# Patient Record
Sex: Male | Born: 1970 | Race: White | Hispanic: No | Marital: Married | Smoking: Current every day smoker
Health system: Southern US, Community
[De-identification: ages and names within clinical notes are randomized; demographics above are authoritative.]

---

## 2014-10-10 ENCOUNTER — Emergency Department (HOSPITAL_COMMUNITY): Payer: Self-pay

## 2014-10-10 ENCOUNTER — Encounter (HOSPITAL_COMMUNITY): Payer: Self-pay | Admitting: Emergency Medicine

## 2014-10-10 ENCOUNTER — Emergency Department (HOSPITAL_COMMUNITY)
Admission: EM | Admit: 2014-10-10 | Discharge: 2014-10-10 | Disposition: A | Discharge status: Home / Self Care | Payer: Self-pay | Attending: Emergency Medicine | Admitting: Emergency Medicine

## 2014-10-10 DIAGNOSIS — M25461 Effusion, right knee: Secondary | ICD-10-CM | POA: Insufficient documentation

## 2014-10-10 DIAGNOSIS — Z720 Tobacco use: Secondary | ICD-10-CM | POA: Insufficient documentation

## 2014-10-10 DIAGNOSIS — M1711 Unilateral primary osteoarthritis, right knee: Secondary | ICD-10-CM | POA: Insufficient documentation

## 2014-10-10 MED ORDER — DEXAMETHASONE 4 MG PO TABS: 4.0000 mg | ORAL_TABLET | Freq: Two times a day (BID) | ORAL | Status: AC

## 2014-10-10 MED ORDER — KETOROLAC TROMETHAMINE 10 MG PO TABS
10.0000 mg | ORAL_TABLET | Freq: Once | ORAL | Status: AC
  Administered 2014-10-10: 10 mg via ORAL
  Filled 2014-10-10: qty 1

## 2014-10-10 MED ORDER — DICLOFENAC SODIUM 75 MG PO TBEC: 75.0000 mg | DELAYED_RELEASE_TABLET | Freq: Two times a day (BID) | ORAL | Status: DC

## 2014-10-10 MED ORDER — PREDNISONE 50 MG PO TABS
60.0000 mg | ORAL_TABLET | Freq: Once | ORAL | Status: AC
  Administered 2014-10-10: 60 mg via ORAL
  Filled 2014-10-10 (×2): qty 1

## 2014-10-10 MED ORDER — ONDANSETRON HCL 4 MG PO TABS
4.0000 mg | ORAL_TABLET | Freq: Once | ORAL | Status: AC
  Administered 2014-10-10: 4 mg via ORAL
  Filled 2014-10-10: qty 1

## 2014-10-10 NOTE — Discharge Instructions (Signed)
Knee Effusion  Knee effusion means you have fluid in your knee. The knee may be more difficult to bend and move. HOME CARE  Use crutches or a brace as told by your doctor.  Put ice on the injured area.  Put ice in a plastic bag.  Place a towel between your skin and the bag.  Leave the ice on for 15-20 minutes, 03-04 times a day.  Raise (elevate) your knee as much as possible.  Only take medicine as told by your doctor.  You may need to do strengthening exercises. Ask your doctor.  Continue with your normal diet and activities as told by your doctor. GET HELP RIGHT AWAY IF:  You have more puffiness (swelling) in your knee.  You see redness, puffiness, or have more pain in your knee.  You have a temperature by mouth above 102 F (38.9 C).  You get a rash.  You have trouble breathing.  You have a reaction to any medicine you are taking.  You have a lot of pain when you move your knee. MAKE SURE YOU:  Understand these instructions.  Will watch your condition.  Will get help right away if you are not doing well or get worse. Document Released: 09/21/2010 Document Revised: 11/11/2011 Document Reviewed: 09/21/2010 [facility] Patient Information 2015 [place], [place]. This information is not intended to replace advice given to you by your health care provider. Make sure you discuss any questions you have with your health care provider.  Arthritis, Nonspecific Arthritis is pain, redness, warmth, or puffiness (inflammation) of a joint. The joint may be stiff or hurt when you move it. One or more joints may be affected. There are many types of arthritis. Your doctor may not know what type you have right away. The most common cause of arthritis is wear and tear on the joint (osteoarthritis). HOME CARE   Only take medicine as told by your doctor.  Rest the joint as much as possible.  Raise (elevate) your joint if it is puffy.  Use crutches if the painful joint is in your  leg.  Drink enough fluids to keep your pee (urine) clear or pale yellow.  Follow your doctor's diet instructions.  Use cold packs for very bad joint pain for 10 to 15 minutes every hour. Ask your doctor if it is okay for you to use hot packs.  Exercise as told by your doctor.  Take a warm shower if you have stiffness in the morning.  Move your sore joints throughout the day. GET HELP RIGHT AWAY IF:   You have a fever.  You have very bad joint pain, puffiness, or redness.  You have many joints that are painful and puffy.  You are not getting better with treatment.  You have very bad back pain or leg weakness.  You cannot control when you poop (bowel movement) or pee (urinate).  You do not feel better in 24 hours or are getting worse.  You are having side effects from your medicine. MAKE SURE YOU:   Understand these instructions.  Will watch your condition.  Will get help right away if you are not doing well or get worse. Document Released: 11/13/2009 Document Revised: 02/18/2012 Document Reviewed: 11/13/2009 [facility] Patient Information 2015 [place], [place]. This information is not intended to replace advice given to you by your health care provider. Make sure you discuss any questions you have with your health care provider.

## 2014-10-10 NOTE — ED Provider Notes (Signed)
CSN: [id number]     Arrival date & time 10/10/14  1007 History  This chart was scribed for Ivery Quale, PA-C with Vanetta Mulders, MD by Tonye Royalty, ED Scribe. This patient was seen in room APFT21/APFT21 and the patient's care was started at 12:16 PM.    Chief Complaint  Patient presents with  . Knee Pain   Patient is a 44 y.o. male presenting with knee pain. The history is provided by the patient. No language interpreter was used.  Knee Pain Location:  Knee Time since incident:  1 week Injury: no   Knee location:  L knee Pain details:    Quality:  Unable to specify   Radiates to:  Does not radiate   Severity:  Moderate   Onset quality:  Sudden   Duration:  1 week   Timing:  Constant   Progression:  Unchanged Chronicity:  New Dislocation: no   Foreign body present:  No foreign bodies Tetanus status:  Unknown Prior injury to area:  No Relieved by:  None tried Worsened by:  Nothing tried Ineffective treatments:  None tried Associated symptoms: swelling   Associated symptoms: no back pain   Risk factors: no frequent fractures and no obesity     HPI Comments: Patrick Atkinson is a 44 y.o. male who presents to the Emergency Department complaining of left knee pain and swelling with onset 1 week ago after lifting his wife. He states he does a lot of lifting and bending with the knee; he notes his wife is a paraplegic. He denies playing any sports but states he worked out a lot. He denies prior operations on his knee.   History reviewed. No pertinent past medical history. History reviewed. No pertinent past surgical history. No family history on file. History  Substance Use Topics  . Smoking status: Current Every Day Smoker -- 1.00 packs/day    Types: Cigarettes  . Smokeless tobacco: Not on file  . Alcohol Use: No    Review of Systems  Musculoskeletal: Negative for back pain.       Left knee pain and swelling  Neurological: Negative for numbness.  All other systems reviewed  and are negative.     Allergies  Review of patient's allergies indicates no known allergies.  Home Medications   Prior to Admission medications   Medication Sig Start Date End Date Taking? Authorizing Provider  acetaminophen (TYLENOL) 500 MG tablet Take 1,000 mg by mouth every 6 (six) hours as needed for moderate pain.   Yes Historical Provider, MD  ibuprofen (ADVIL,MOTRIN) 200 MG tablet Take 400 mg by mouth every 6 (six) hours as needed for moderate pain.   Yes Historical Provider, MD   BP 112/86 mmHg  Pulse 69  Temp(Src) 98.3 F (36.8 C)  Resp 18  Ht  (1.676 m)  Wt 156 lb (70.761 kg)  BMI 25.19 kg/m2  SpO2 100% Physical Exam  Constitutional: He is oriented to person, place, and time. He appears well-developed and well-nourished.  HENT:  Head: Normocephalic and atraumatic.  Eyes: Conjunctivae are normal.  Neck: Normal range of motion. Neck supple.  Cardiovascular: Normal rate, regular rhythm and normal heart sounds.   No murmur heard. Pulmonary/Chest: Effort normal and breath sounds normal. No respiratory distress. He has no wheezes. He has no rales.  Musculoskeletal: Normal range of motion.  The left knee is not hot Moderate effusion of the joint No deformity of the anterior tibial tuberosity  No deformity of the quadricep Patella in  the midline with decreased mobility No edema of the lower extremity  Neurological: He is alert and oriented to person, place, and time.  No motor deficit appreciated  Skin: Skin is warm and dry.  Psychiatric: He has a normal mood and affect.  Nursing note and vitals reviewed.   ED Course  Procedures (including critical care time)  DIAGNOSTIC STUDIES: Oxygen Saturation is 100% on room air, normal by my interpretation.    COORDINATION OF CARE: 12:21 PM Discussed with patient that his x-ray reveals of evidence of significant arthritis with bone spur and chondrocalcinosis. Discussed treatment plan with patient at beside, including  follow up with orthopedist. The patient agrees with the plan and has no further questions at this time.   Labs Review Labs Reviewed - No data to display  Imaging Review Dg Knee Complete 4 Views Left  10/10/2014   CLINICAL DATA:  Twisting injury left knee 10/03/2014. Continued pain. Initial encounter.  EXAM: LEFT KNEE - COMPLETE 4+ VIEW  COMPARISON:  None.  FINDINGS: Moderate to moderately large joint effusion is identified. No acute bony or joint abnormality is seen. Mild chondrocalcinosis is noted. Small spur off the superior pole of the patella is identified.  IMPRESSION: Moderate to moderately large joint effusion.  Negative for fracture.  Mild chondrocalcinosis.   Electronically Signed   By: Drusilla Kannerhomas  Dalessio M.D.   On: 10/10/2014 11:11     EKG Interpretation None      MDM  No hot joints appreciated. No evidence for dislocation or fracture. X-ray of the left knee reveals advanced degenerative changes with small spur noted at the superior pole of the patella, and moderate to moderately large joint effusion. Is also evidence of slow chondrocalcinosis.  The patient will be referred to orthopedics. He'll be treated with Decadron and Celebrex.    Final diagnoses:  None    **I have reviewed nursing notes, vital signs, and all appropriate lab and imaging results for this patient.*  *I personally performed the services described in this documentation, which was scribed in my presence. The recorded information has been reviewed and is accurate.Kathie Dike*  Debie Ashline M Aedon Deason, PA-C 10/10/14 1245  Vanetta MuldersScott Zackowski, MD 10/12/14 [phone]

## 2014-10-10 NOTE — ED Notes (Signed)
Pt c/o left knee pain and swelling x 7 days after lifting his wife.

## 2014-10-10 NOTE — ED Notes (Signed)
Gave patient ice pack to place on knee.

## 2014-11-03 ENCOUNTER — Emergency Department (HOSPITAL_COMMUNITY)
Admission: EM | Admit: 2014-11-03 | Discharge: 2014-11-03 | Disposition: A | Discharge status: Home / Self Care | Payer: Self-pay | Attending: Emergency Medicine | Admitting: Emergency Medicine

## 2014-11-03 ENCOUNTER — Encounter (HOSPITAL_COMMUNITY): Payer: Self-pay | Admitting: *Deleted

## 2014-11-03 DIAGNOSIS — Z720 Tobacco use: Secondary | ICD-10-CM | POA: Insufficient documentation

## 2014-11-03 DIAGNOSIS — Z791 Long term (current) use of non-steroidal anti-inflammatories (NSAID): Secondary | ICD-10-CM | POA: Insufficient documentation

## 2014-11-03 DIAGNOSIS — N200 Calculus of kidney: Secondary | ICD-10-CM | POA: Insufficient documentation

## 2014-11-03 DIAGNOSIS — Z79899 Other long term (current) drug therapy: Secondary | ICD-10-CM | POA: Insufficient documentation

## 2014-11-03 LAB — URINALYSIS, ROUTINE W REFLEX MICROSCOPIC
Bilirubin Urine: NEGATIVE
Glucose, UA: NEGATIVE mg/dL
Ketones, ur: NEGATIVE mg/dL
Leukocytes, UA: NEGATIVE
Nitrite: NEGATIVE
Protein, ur: NEGATIVE mg/dL
Specific Gravity, Urine: 1.01 (ref 1.005–1.030)
Urobilinogen, UA: 0.2 mg/dL (ref 0.0–1.0)
pH: 6 (ref 5.0–8.0)

## 2014-11-03 LAB — URINE MICROSCOPIC-ADD ON

## 2014-11-03 MED ORDER — TAMSULOSIN HCL 0.4 MG PO CAPS: 0.4000 mg | ORAL_CAPSULE | Freq: Every day | ORAL | Status: AC

## 2014-11-03 MED ORDER — KETOROLAC TROMETHAMINE 60 MG/2ML IM SOLN
60.0000 mg | Freq: Once | INTRAMUSCULAR | Status: AC
  Administered 2014-11-03: 60 mg via INTRAMUSCULAR
  Filled 2014-11-03: qty 2

## 2014-11-03 MED ORDER — IBUPROFEN 800 MG PO TABS: 800.0000 mg | ORAL_TABLET | Freq: Three times a day (TID) | ORAL | Status: AC

## 2014-11-03 MED ORDER — ONDANSETRON HCL 4 MG PO TABS: 4.0000 mg | ORAL_TABLET | Freq: Four times a day (QID) | ORAL | Status: AC

## 2014-11-03 MED ORDER — OXYCODONE-ACETAMINOPHEN 5-325 MG PO TABS: 2.0000 | ORAL_TABLET | ORAL | Status: AC | PRN

## 2014-11-03 NOTE — ED Notes (Signed)
Low back pain, nausea, hematuria, dysuria,

## 2014-11-03 NOTE — ED Notes (Signed)
Dr Hyacinth MeekerMiller states we are waiting on patients UA result before he can be discharged.

## 2014-11-03 NOTE — ED Provider Notes (Signed)
44 year old male with no prior history of urinary calculi or urinary infections who presents after having acute onset of right flank pain several days ago. This has been intermittent, he noticed that yesterday he had severe dysuria and red urine, today he does have some residual discomfort but it is not as bad as it was yesterday. This pain is also worse with moving somewhat. He has no lower extremity symptoms, no pain in his bilateral testicles, he does have some residual pain in his penile shaft. On exam the patient has clear urine at the bedside which appears yellow, no significant abdominal discomfort, no lymphadenopathy in the groin, no CVA tenderness. Bedside ultrasound results show no signs of hydronephrosis. Check urinalysis, anticipate discharge with pain medication, possibly antibiotics if infected that this does appear that he has passed a kidney stone.  Emergency Focused Ultrasound Exam Limited retroperitoneal ultrasound of kidneys  Performed and interpreted by Dr. Hyacinth MeekerMiller Indication: flank pain Focused abdominal ultrasound with both kidneys imaged in transverse and longitudinal planes in real-time. Interpretation: No hydronephrosis visualized.   Images archived electronically  Medical screening examination/treatment/procedure(s) were conducted as a shared visit with non-physician practitioner(s) and myself.  I personally evaluated the patient during the encounter.  Clinical Impression:   Final diagnoses:  Nephrolithiasis         Vida RollerBrian D Kymere Fullington, MD 11/03/14 2350

## 2014-11-03 NOTE — Discharge Instructions (Signed)
Cool Mist Vaporizers Vaporizers may help relieve the symptoms of a cough and cold. They add moisture to the air, which helps mucus to become thinner and less sticky. This makes it easier to breathe and cough up secretions. Cool mist vaporizers do not cause serious burns like hot mist vaporizers, which may also be called steamers or humidifiers. Vaporizers have not been proven to help with colds. You should not use a vaporizer if you are allergic to mold. HOME CARE INSTRUCTIONS  Follow the package instructions for the vaporizer.  Do not use anything other than distilled water in the vaporizer.  Do not run the vaporizer all of the time. This can cause mold or bacteria to grow in the vaporizer.  Clean the vaporizer after each time it is used.  Clean and dry the vaporizer well before storing it.  Stop using the vaporizer if worsening respiratory symptoms develop. Document Released: 05/16/2004 Document Revised: 08/24/2013 Document Reviewed: 01/06/2013 [facility]Exit[facility] Patient Information 2015 [place], [place]. This information is not intended to replace advice given to you by your health care provider. Make sure you discuss any questions you have with your health care provider.  Please continue Ibuprofen and tylenol therapy as recommended by pediatrician. If symptoms worsen seek immediate medical care.  Dietary Guidelines to Help Prevent Kidney Stones Your risk of kidney stones can be decreased by adjusting the foods you eat. The most important thing you can do is drink enough fluid. You should drink enough fluid to keep your urine clear or pale yellow. The following guidelines provide specific information for the type of kidney stone you have had. GUIDELINES ACCORDING TO TYPE OF KIDNEY STONE Calcium Oxalate Kidney Stones  Reduce the amount of salt you eat. Foods that have a lot of salt cause your body to release excess calcium into your urine. The excess calcium can combine with a substance called oxalate  to form kidney stones.  Reduce the amount of animal protein you eat if the amount you eat is excessive. Animal protein causes your body to release excess calcium into your urine. Ask your dietitian how much protein from animal sources you should be eating.  Avoid foods that are high in oxalates. If you take vitamins, they should have less than 500 mg of vitamin C. Your body turns vitamin C into oxalates. You do not need to avoid fruits and vegetables high in vitamin C. Calcium Phosphate Kidney Stones  Reduce the amount of salt you eat to help prevent the release of excess calcium into your urine.  Reduce the amount of animal protein you eat if the amount you eat is excessive. Animal protein causes your body to release excess calcium into your urine. Ask your dietitian how much protein from animal sources you should be eating.  Get enough calcium from food or take a calcium supplement (ask your dietitian for recommendations). Food sources of calcium that do not increase your risk of kidney stones include:  Broccoli.  Dairy products, such as cheese and yogurt.  Pudding. Uric Acid Kidney Stones  Do not have more than 6 oz of animal protein per day. FOOD SOURCES Animal Protein Sources  Meat (all types).  Poultry.  Eggs.  Fish, seafood. Foods High in MirantSalt  Salt seasonings.  Soy sauce.  Teriyaki sauce.  Cured and processed meats.  Salted crackers and snack foods.  Fast food.  Canned soups and most canned foods. Foods High in Oxalates  Grains:  Amaranth.  Barley.  Grits.  Wheat germ.  Bran.  Buckwheat flour.  All bran cereals.  Pretzels.  Whole wheat bread.  Vegetables:  Beans (wax).  Beets and beet greens.  Collard greens.  Eggplant.  Escarole.  Leeks.  Okra.  Parsley.  Rutabagas.  Spinach.  Swiss chard.  Tomato paste.  Fried potatoes.  Sweet potatoes.  Fruits:  Red currants.  Figs.  Kiwi.  Rhubarb.  Meat and Other  Protein Sources:  Beans (dried).  Soy burgers and other soybean products.  Miso.  Nuts (peanuts, almonds, pecans, cashews, hazelnuts).  Nut butters.  Sesame seeds and tahini (paste made of sesame seeds).  Poppy seeds.  Beverages:  Chocolate drink mixes.  Soy milk.  Instant iced tea.  Juices made from high-oxalate fruits or vegetables.  Other:  Carob.  Chocolate.  Fruitcake.  Marmalades. Document Released: 12/14/2010 Document Revised: 08/24/2013 Document Reviewed: 07/16/2013 [facility] Patient Information 2015 [place], [place]. This information is not intended to replace advice given to you by your health care provider. Make sure you discuss any questions you have with your health care provider.  Kidney Stones Kidney stones (urolithiasis) are solid masses that form inside your kidneys. The intense pain is caused by the stone moving through the kidney, ureter, bladder, and urethra (urinary tract). When the stone moves, the ureter starts to spasm around the stone. The stone is usually passed in your pee (urine).  HOME CARE  Drink enough fluids to keep your pee clear or pale yellow. This helps to get the stone out.  Strain all pee through the provided strainer. Do not pee without peeing through the strainer, not even once. If you pee the stone out, catch it in the strainer. The stone may be as small as a grain of salt. Take this to your doctor. This will help your doctor figure out what you can do to try to prevent more kidney stones.  Only take medicine as told by your doctor.  Follow up with your doctor as told.  Get follow-up X-rays as told by your doctor. GET HELP IF: You have pain that gets worse even if you have been taking pain medicine. GET HELP RIGHT AWAY IF:   Your pain does not get better with medicine.  You have a fever or shaking chills.  Your pain increases and gets worse over 18 hours.  You have new belly (abdominal) pain.  You feel faint or pass  out.  You are unable to pee. MAKE SURE YOU:   Understand these instructions.  Will watch your condition.  Will get help right away if you are not doing well or get worse. Document Released: 02/05/2008 Document Revised: 04/21/2013 Document Reviewed: 01/20/2013 [facility] Patient Information 2015 [place], [place]. This information is not intended to replace advice given to you by your health care provider. Make sure you discuss any questions you have with your health care provider.  Please follow-up with PCP for further management of recurrent kidney stones. If symptoms worsen please return to Ed for further evaluation.

## 2014-11-03 NOTE — ED Provider Notes (Signed)
CSN: [id number]     Arrival date & time 11/03/14  1449 History   First MD Initiated Contact with Patient 11/03/14 1526     Chief Complaint  Patient presents with  . Hematuria   HPI   [age] YOM presents after 3 days of right flank pain, painful urination, and hematuria. He states he first noticed the back pain follwed by localized flank pain. He reports a decrease in urine output followed by an acute episode of painful burning urination with gross hematuria. He notes after that episode urination were normal with no pain. He continues to endorse right flank pain but not to the extent he had before. Also notes N/V. Pt some mild abdominal discomfort that is described as pressure radiating to the flank. Denies inguinal or testicular pain, no bulge noted in groin/strotum. Reports sweats associated with pain, denies fever, SOB, or chest pain.   Denies history of the same, reports his only medical history is an arthritic that was treated with a course of NSAIDS.   History reviewed. No pertinent past medical history. History reviewed. No pertinent past surgical history. History reviewed. No pertinent family history. History  Substance Use Topics  . Smoking status: Current Every Day Smoker -- 1.00 packs/day    Types: Cigarettes  . Smokeless tobacco: Not on file  . Alcohol Use: No    Review of Systems  All other systems reviewed and are negative.   Allergies  Review of patient's allergies indicates no known allergies.  Home Medications   Prior to Admission medications   Medication Sig Start Date End Date Taking? Authorizing Provider  acetaminophen (TYLENOL) 500 MG tablet Take 1,000 mg by mouth every 6 (six) hours as needed for moderate pain.    Historical Provider, MD  dexamethasone (DECADRON) 4 MG tablet Take 1 tablet (4 mg total) by mouth 2 (two) times daily with a meal. 10/10/14   Kathie DikeHobson M Bryant, PA-C  diclofenac (VOLTAREN) 75 MG EC tablet Take 1 tablet (75 mg total) by mouth 2 (two) times  daily. 10/10/14   Kathie DikeHobson M Bryant, PA-C  ibuprofen (ADVIL,MOTRIN) 200 MG tablet Take 400 mg by mouth every 6 (six) hours as needed for moderate pain.    Historical Provider, MD   BP 149/87 mmHg  Pulse 109  Temp(Src) 98.2 F (36.8 C) (Oral)  Resp 20  Ht 5\' 6"  (1.676 m)  Wt 165 lb (74.844 kg)  BMI 26.64 kg/m2  SpO2 98% Physical Exam  Constitutional: He is oriented to person, place, and time. He appears well-developed and well-nourished.  HENT:  Head: Normocephalic and atraumatic.  Eyes: Pupils are equal, round, and reactive to light.  Neck: Normal range of motion. Neck supple. No JVD present. No tracheal deviation present. No thyromegaly present.  Cardiovascular: Normal rate, regular rhythm, normal heart sounds and intact distal pulses.  Exam reveals no gallop and no friction rub.   No murmur heard. Pulmonary/Chest: Effort normal and breath sounds normal. No stridor. No respiratory distress. He has no wheezes. He has no rales. He exhibits no tenderness.  Abdominal: Soft. Bowel sounds are normal. He exhibits no distension and no mass. There is tenderness. There is no rebound and no guarding.  Musculoskeletal: Normal range of motion.  Lymphadenopathy:    He has no cervical adenopathy.  Neurological: He is alert and oriented to person, place, and time. Coordination normal.  Skin: Skin is warm and dry.  Psychiatric: He has a normal mood and affect. His behavior is normal. Judgment and thought  content normal.  Nursing note and vitals reviewed.   ED Course  Procedures (including critical care time) Labs Review Labs Reviewed  URINALYSIS, ROUTINE W REFLEX MICROSCOPIC    Imaging Review No results found.   EKG Interpretation None     MDM   Final diagnoses:  Nephrolithiasis    Pt's presentation likely represents nephrolithiasis with passed stone. No evidence of hydronephrosis seen with bedside ultrasound. Large amounts of Hgb shown on urine dipstick.  Pt is able to pass urine and  nausea/vomitting controlled. He was discharged with ibuprofen, oxycodone, zofran, and flomax. Instructed to monitor for new or worsening symptoms and return if uncontrolled with prescription medication. Advised to follow up with PCP.       Kelle Darting Leeam Cedrone, PA-C 11/03/14 2340  Vida Roller, MD 11/03/14 [phone]

## 2015-06-10 IMAGING — DX DG KNEE COMPLETE 4+V*L*
4 series · 4 of 4 positions shown · non-contrast
Comparison: None.

CLINICAL DATA: Twisting injury left knee 10/03/2014. Continued
pain. Initial encounter.

EXAM:
LEFT KNEE - COMPLETE 4+ VIEW

[knee ap]
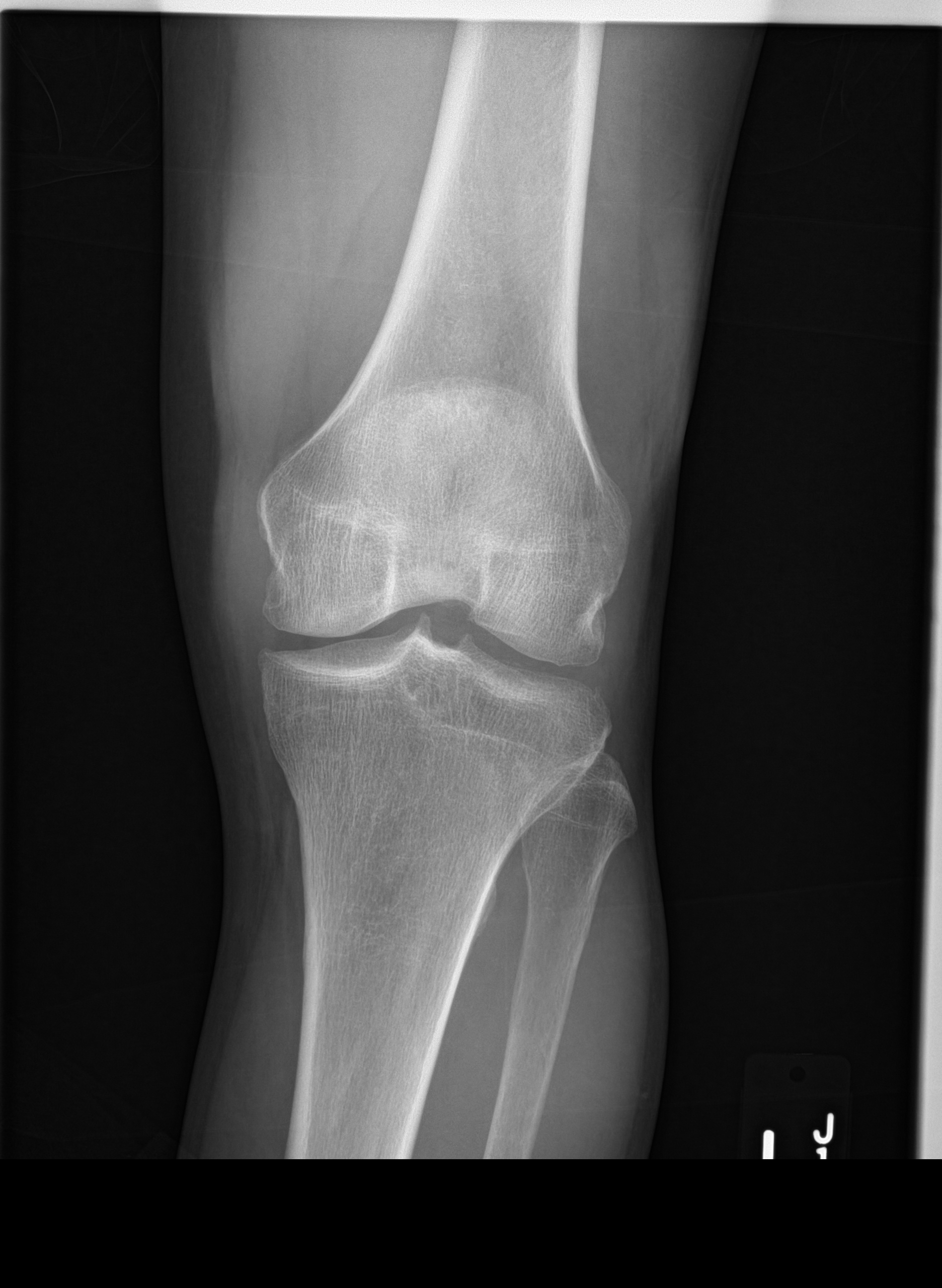

[knee obl (1 of 2)]
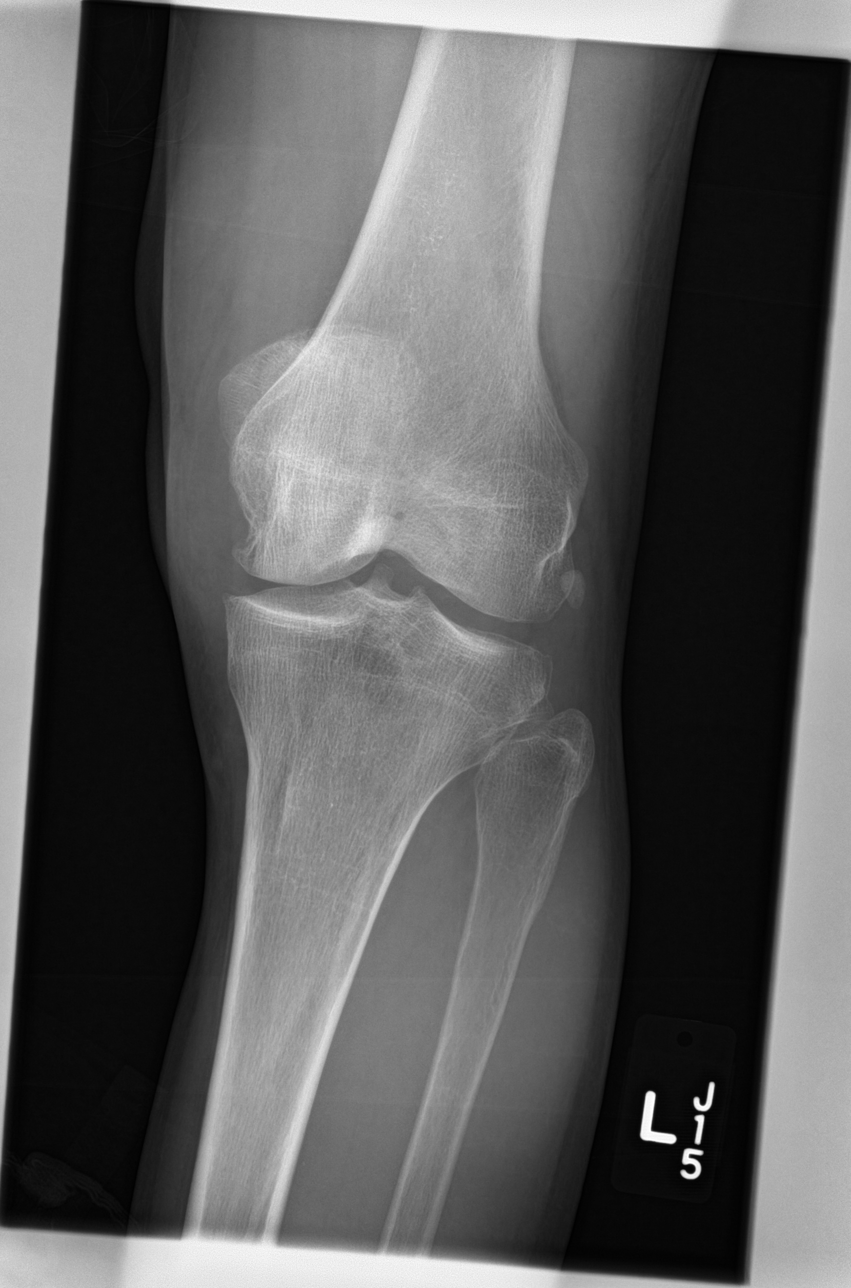

[knee obl (2 of 2)]
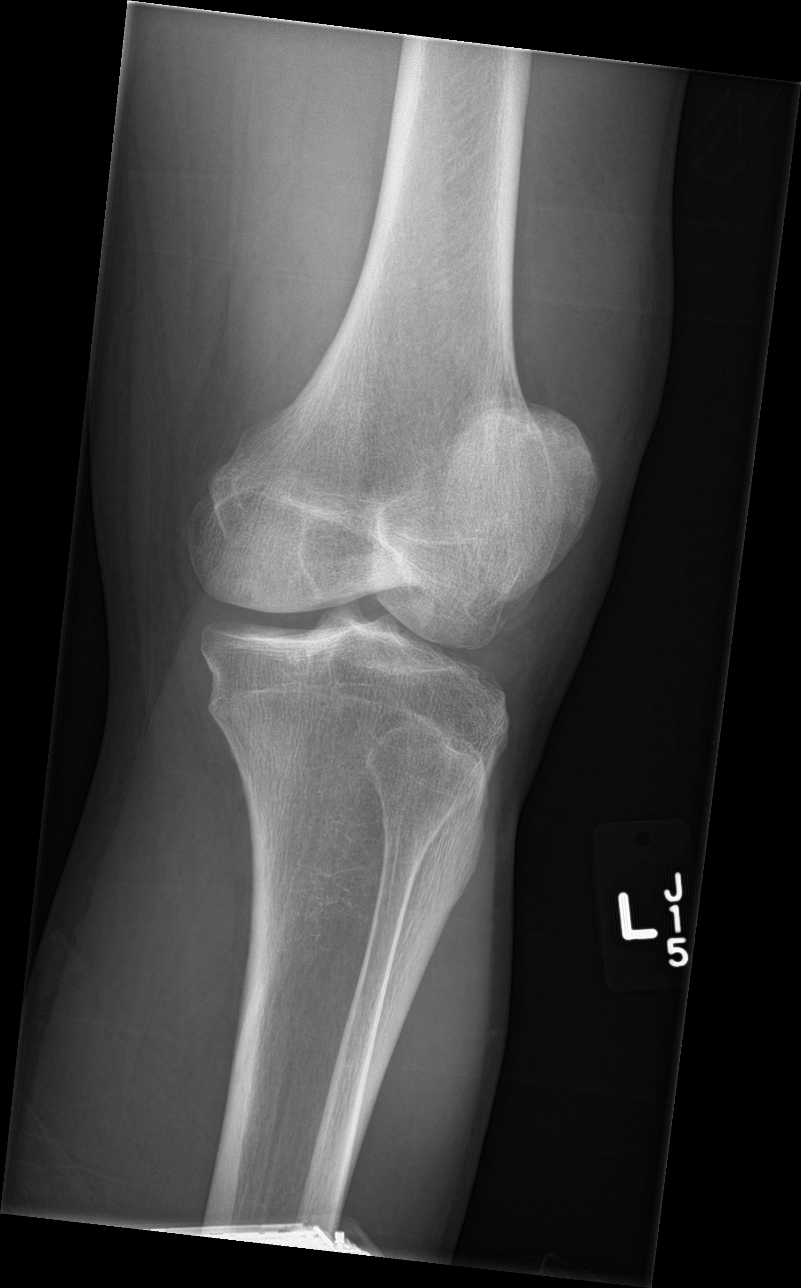

[knee lat]
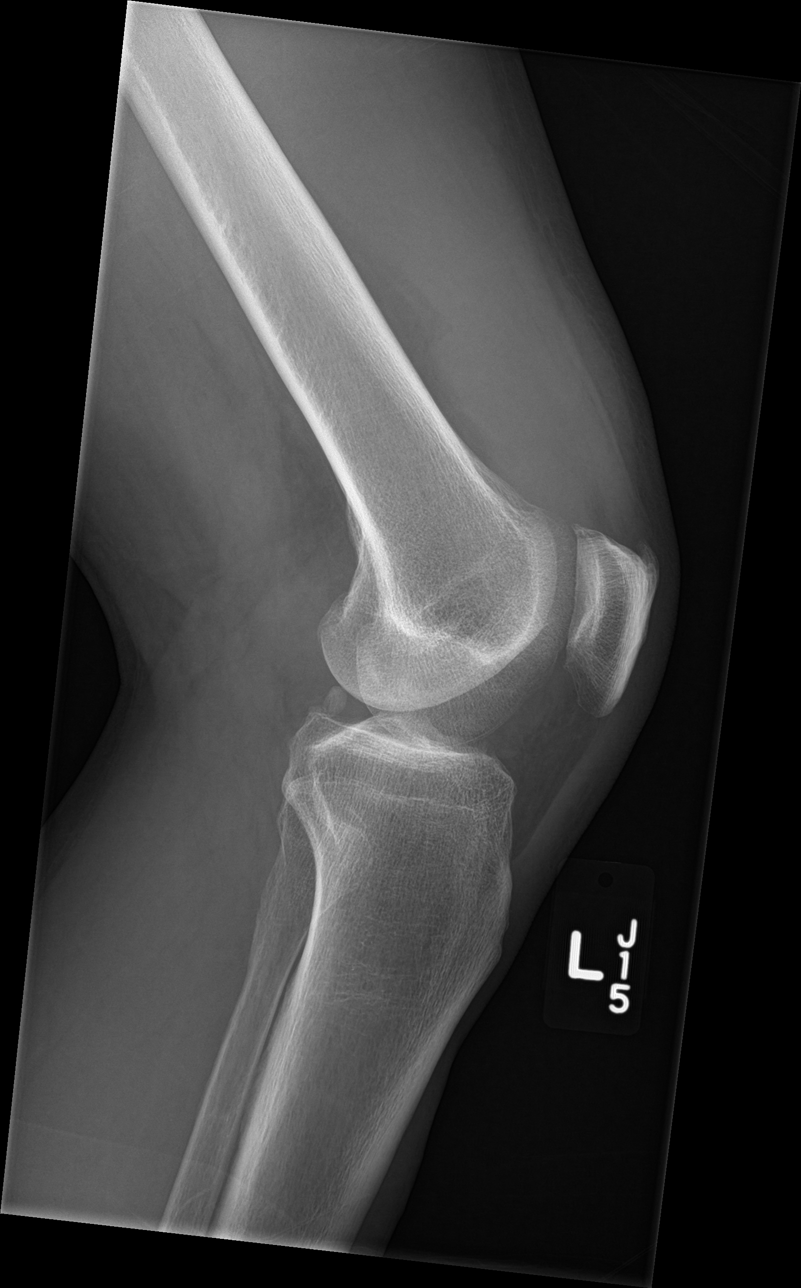

[4 of 4 positions shown; findings below may reference images not displayed]

FINDINGS: Moderate to moderately large joint effusion is identified. No acute
bony or joint abnormality is seen. Mild chondrocalcinosis is noted.
Small spur off the superior pole of the patella is identified.
IMPRESSION: Moderate to moderately large joint effusion.

Negative for fracture.

Mild chondrocalcinosis.
